# Patient Record
Sex: Female | Born: 1995 | Race: White | Hispanic: No | Marital: Single | Smoking: Former smoker
Health system: Southern US, Community
[De-identification: ages and names within clinical notes are randomized; demographics above are authoritative.]

## PROBLEM LIST (undated history)

## (undated) DIAGNOSIS — F419 Anxiety disorder, unspecified: Secondary | ICD-10-CM

---

## 2017-08-11 ENCOUNTER — Encounter (HOSPITAL_COMMUNITY): Payer: Self-pay

## 2017-08-11 DIAGNOSIS — O208 Other hemorrhage in early pregnancy: Secondary | ICD-10-CM | POA: Insufficient documentation

## 2017-08-11 DIAGNOSIS — Z3A01 Less than 8 weeks gestation of pregnancy: Secondary | ICD-10-CM | POA: Insufficient documentation

## 2017-08-11 DIAGNOSIS — Z3491 Encounter for supervision of normal pregnancy, unspecified, first trimester: Secondary | ICD-10-CM | POA: Insufficient documentation

## 2017-08-11 DIAGNOSIS — R102 Pelvic and perineal pain: Secondary | ICD-10-CM | POA: Insufficient documentation

## 2017-08-11 LAB — I-STAT BETA HCG BLOOD, ED (MC, WL, AP ONLY): HCG, QUANTITATIVE: 18.2 m[IU]/mL — AB (ref <5)

## 2017-08-11 NOTE — ED Triage Notes (Signed)
Pt states she bleed through pad in less than 30 minutes with sore breast; pt states she took pregnancy test today and it showed positive; pt states bleeding has lightened; Pt state she was due for cycle on the 1st. Pt states slight abdominal cramping at [facility]2/10-Monique,RN

## 2017-08-12 ENCOUNTER — Emergency Department (HOSPITAL_COMMUNITY)
Admission: EM | Admit: 2017-08-12 | Discharge: 2017-08-12 | Disposition: A | Discharge status: Home / Self Care | Payer: Self-pay | Attending: Emergency Medicine | Admitting: Emergency Medicine

## 2017-08-12 ENCOUNTER — Emergency Department (HOSPITAL_COMMUNITY): Payer: Self-pay

## 2017-08-12 DIAGNOSIS — O3680X0 Pregnancy with inconclusive fetal viability, not applicable or unspecified: Secondary | ICD-10-CM

## 2017-08-12 LAB — BASIC METABOLIC PANEL
ANION GAP: 9 (ref 5–15)
BUN: 12 mg/dL (ref 6–20)
CALCIUM: 9.2 mg/dL (ref 8.9–10.3)
CO2: 23 mmol/L (ref 22–32)
Chloride: 104 mmol/L (ref 101–111)
Creatinine, Ser: 0.52 mg/dL (ref 0.44–1.00)
GLUCOSE: 91 mg/dL (ref 65–99)
POTASSIUM: 3.5 mmol/L (ref 3.5–5.1)
Sodium: 136 mmol/L (ref 135–145)

## 2017-08-12 LAB — CBC
HEMATOCRIT: 37.1 % (ref 36.0–46.0)
Hemoglobin: 12.3 g/dL (ref 12.0–15.0)
MCH: 30.1 pg (ref 26.0–34.0)
MCHC: 33.2 g/dL (ref 30.0–36.0)
MCV: 90.7 fL (ref 78.0–100.0)
PLATELETS: 239 10*3/uL (ref 150–400)
RBC: 4.09 MIL/uL (ref 3.87–5.11)
RDW: 13.4 % (ref 11.5–15.5)
WBC: 7.2 10*3/uL (ref 4.0–10.5)

## 2017-08-12 LAB — TYPE AND SCREEN
ABO/RH(D): O POS
ANTIBODY SCREEN: NEGATIVE

## 2017-08-12 LAB — HCG, QUANTITATIVE, PREGNANCY: HCG, BETA CHAIN, QUANT, S: 20 m[IU]/mL — AB (ref <5)

## 2017-08-12 NOTE — Discharge Instructions (Signed)
Please report [facility] the [facility], the Maternal Admissions Unit, in 2 days for a repeat lab test to check your beta hCG level.  It is important to trend this lab value to monitor your pregnancy. It is also recommended that you have a repeat ultrasound in 2 weeks. Return to the ER if you begin having heavy vaginal bleeding, severe abdominal pain, or new or concerning symptoms.

## 2017-08-12 NOTE — ED Provider Notes (Signed)
[facility] Provider Note   CSN: [id number] Arrival date & time: 08/11/17  2122     History   Chief Complaint Chief Complaint  Patient presents with  . Vaginal Bleeding  . Possible Pregnancy    HPI Rozell SearingMarlyna Anna is a 21 y.o. female, G1P1, presenting to the ED with vaginal bleeding that began last night and stopped in the morning. She states the bleeding was heavy with associated very minor pelvic cramping.  Patient states she is 4 months postpartum, with term cesarean delivery April 15, 2017.  She states her LMP was September 1 and lasted 2 weeks, as periods are still normalizing.  States she is not breast-feeding.  Ports associated symptoms are breast tenderness.  She states she took a home pregnancy test which was positive, and presents today for concern of ectopic pregnancy due to bleeding.  Denies nausea or vomiting, current vaginal bleeding, urinary symptoms, or any other complaints. [facility].  The history is provided by the patient.    History reviewed. No pertinent past medical history.  There are no active problems to display for this patient.   Past Surgical History:  Procedure Laterality Date  . CESAREAN SECTION      OB History    No data available       Home Medications    Prior to Admission medications   Not on File    Family History History reviewed. No pertinent family history.  Social History Social History  Substance Use Topics  . Smoking status: Not on file  . Smokeless tobacco: Not on file  . Alcohol use No     Allergies   Penicillins   Review of Systems Review of Systems  Constitutional: Negative for fever.  Gastrointestinal: Negative for nausea and vomiting.  Genitourinary: Positive for pelvic pain and vaginal bleeding. Negative for dysuria and frequency.  All other systems reviewed and are negative.    Physical Exam Updated Vital Signs BP 100/65 (BP Location:  Right Arm)   Pulse 95   Temp 98.3 F (36.8 C) (Oral)   Resp 18   LMP 06/16/2017 (Approximate)   SpO2 100%   Physical Exam  Constitutional: She appears well-developed and well-nourished. No distress.  HENT:  Head: Normocephalic and atraumatic.  Eyes: Conjunctivae are normal.  Cardiovascular: Normal rate, regular rhythm, normal heart sounds and intact distal pulses.   Pulmonary/Chest: Effort normal and breath sounds normal.  Abdominal: Soft. Bowel sounds are normal. She exhibits no distension. There is no tenderness. There is no rebound and no guarding.  Genitourinary: Vagina normal. There is no rash or tenderness on the right labia. There is no rash or tenderness on the left labia. Uterus is not tender. Cervix exhibits no motion tenderness and no discharge. Right adnexum displays no mass and no tenderness. Left adnexum displays no mass and no tenderness. No tenderness or bleeding in the vagina. No vaginal discharge found.  Genitourinary Comments: Exam performed with chaperone present. Os is not open  Neurological: She is alert.  Skin: Skin is warm.  Psychiatric: She has a normal mood and affect. Her behavior is normal.  Nursing note and vitals reviewed.    ED Treatments / Results  Labs (all labs ordered are listed, but only abnormal results are displayed) Labs Reviewed  HCG, QUANTITATIVE, PREGNANCY - Abnormal; Notable for the following:       Result Value   hCG, Beta Chain, Quant, S 20 (*)    All other  components within normal limits  I-STAT BETA HCG BLOOD, ED ([facility], [facility], [facility] ONLY) - Abnormal; Notable for the following:    I-stat hCG, quantitative 18.2 (*)    All other components within normal limits  CBC  BASIC METABOLIC PANEL  TYPE AND SCREEN  ABO/RH    EKG  EKG Interpretation None       Radiology US Ob Comp < 14 Wks  Result Date: 08/12/2017 CLINICAL DATA:  Vaginal bleeding. Positive pregnancy test. Beta HCG 20. Gestational age by last menstrual period 8 weeks and 1  day. EXAM: OBSTETRIC <14 WK [place] AND TRANSVAGINAL OB US TECHNIQUE: Both transabdominal and transvaginal ultrasound examinations were performed for complete evaluation of the gestation as well as the maternal uterus, adnexal regions, and pelvic cul-de-sac. Transvaginal technique was performed to assess early pregnancy. COMPARISON:  None. FINDINGS: Intrauterine gestational sac: Present Yolk sac:  Cyst not present Embryo:  Not present Cardiac Activity: Not present MSD: 2  mm   4 w   6  d Subchorionic hemorrhage:  None visualized. Maternal uterus/adnexae: Small amount of free fluid in the pelvis. 1.3 cm RIGHT intra ovarian corpus luteal cyst. IMPRESSION: Probable early intrauterine gestational sac, but no yolk sac, fetal pole, or cardiac activity yet visualized. Recommend follow-up quantitative B-HCG levels and follow-up US in 14 days to assess viability. This recommendation follows SRU consensus guidelines: Diagnostic Criteria for Nonviable Pregnancy Early in the First Trimester. Malva Limes Med 2013; 696:2952-84. Electronically Signed   By: Awilda Metro M.D.   On: 08/12/2017 05:54   US Ob Transvaginal  Result Date: 08/12/2017 CLINICAL DATA:  Vaginal bleeding. Positive pregnancy test. Beta HCG 20. Gestational age by last menstrual period 8 weeks and 1 day. EXAM: OBSTETRIC <14 WK [place] AND TRANSVAGINAL OB US TECHNIQUE: Both transabdominal and transvaginal ultrasound examinations were performed for complete evaluation of the gestation as well as the maternal uterus, adnexal regions, and pelvic cul-de-sac. Transvaginal technique was performed to assess early pregnancy. COMPARISON:  None. FINDINGS: Intrauterine gestational sac: Present Yolk sac:  Cyst not present Embryo:  Not present Cardiac Activity: Not present MSD: 2  mm   4 w   6  d Subchorionic hemorrhage:  None visualized. Maternal uterus/adnexae: Small amount of free fluid in the pelvis. 1.3 cm RIGHT intra ovarian corpus luteal cyst. IMPRESSION: Probable early  intrauterine gestational sac, but no yolk sac, fetal pole, or cardiac activity yet visualized. Recommend follow-up quantitative B-HCG levels and follow-up US in 14 days to assess viability. This recommendation follows SRU consensus guidelines: Diagnostic Criteria for Nonviable Pregnancy Early in the First Trimester. Malva Limes Med 2013; 132:4401-02. Electronically Signed   By: Awilda Metro M.D.   On: 08/12/2017 05:54    Procedures Procedures (including critical care time)  Medications Ordered in ED Medications - No data to display   Initial Impression / Assessment and Plan / ED Course  I have reviewed the triage vital signs and the nursing notes.  Pertinent labs & imaging results that were available during my care of the patient were reviewed by me and considered in my medical decision making (see chart for details).     Pt presenting with 1 day of vaginal bleeding and positive pregnancy test.  Quantitative hCG is 20.  Hemoglobin is stable, patient is not actively bleeding.  Cervical os appears closed.  Abdomen is nontender.  Pt is nontoxic and not in distress.  Ultrasound done, showing intrauterine gestational sac without visible yolk sac, fetal pole or cardiac  activity.  Discussed these results with patient that this could either be early pregnancy versus threatened miscarriage.  Pt verbalized understanding. [facility] referral given and patient recommended to follow-up in 2 days for repeat hCG and trending.  Also discussed recommendation for patient to receive ultrasound in 2 weeks for follow-up. Pt is well-appearing and safe for discharge.  Patient discussed with Dr. Nicanor Alcon.  Discussed results, findings, treatment and follow up. Patient advised of return precautions. Patient verbalized understanding and agreed with plan.   Final Clinical Impressions(s) / ED Diagnoses   Final diagnoses:  Pregnancy of unknown anatomic location    New Prescriptions There are no discharge  medications for this patient.    Robinson, [place] N, PA-C 08/12/17 Macky Lower, April, MD 08/12/17 [phone]

## 2017-08-13 LAB — ABO/RH: ABO/RH(D): O POS

## 2018-09-26 IMAGING — US US OB TRANSVAGINAL
1 series · 14 of 28 positions shown · non-contrast
Comparison: None.

CLINICAL DATA: Vaginal bleeding. Positive pregnancy test. Beta HCG
20. Gestational age by last menstrual period 8 weeks and 1 day.

EXAM:
OBSTETRIC <14 WK US AND TRANSVAGINAL OB US
TECHNIQUE: Both transabdominal and transvaginal ultrasound examinations were
performed for complete evaluation of the gestation as well as the
maternal uterus, adnexal regions, and pelvic cul-de-sac.
Transvaginal technique was performed to assess early pregnancy.

[Series 1: us ob transvaginal · 0.23mm/px · 67 acquisitions, 14 frames shown]
[im 3/67]
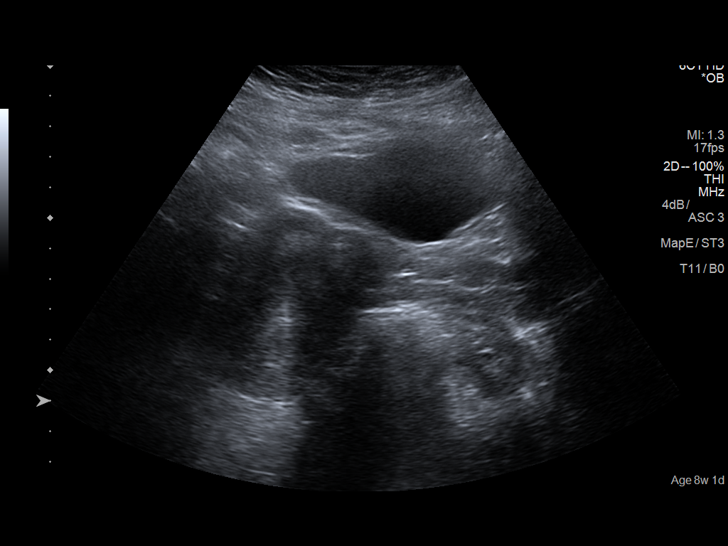
[im 8/67]
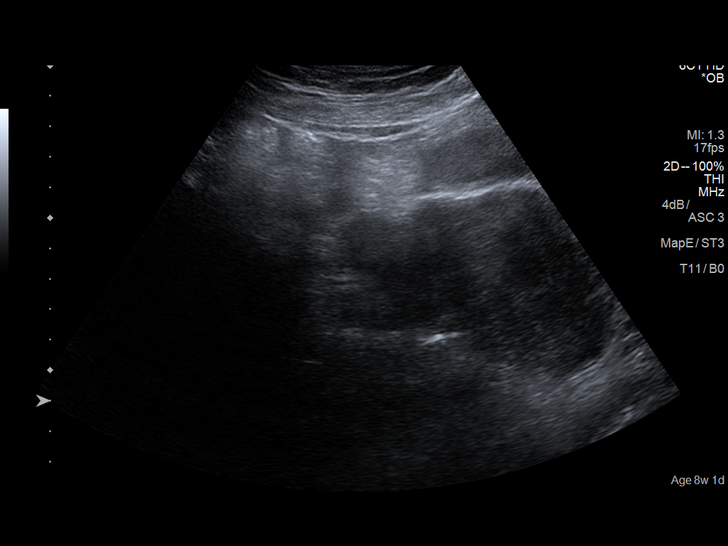
[im 13/67]
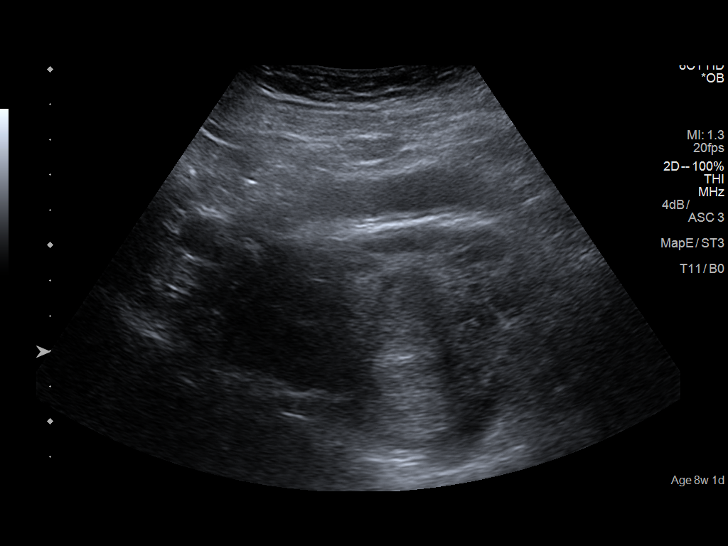
[im 18/67]
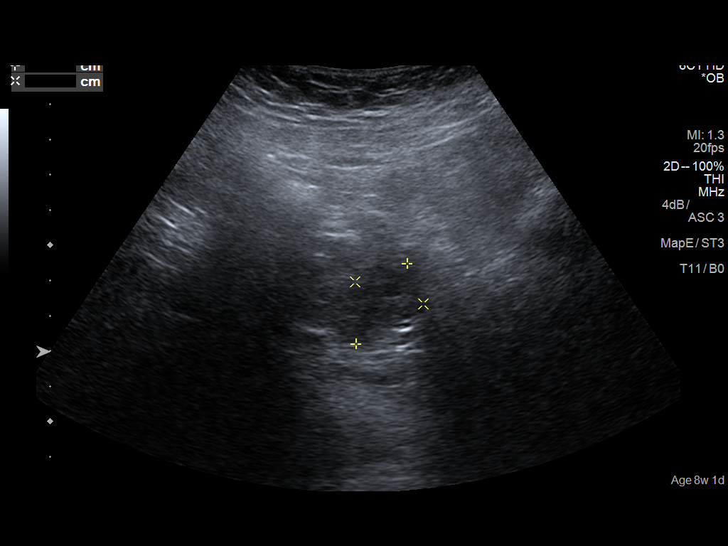
[im 23/67]
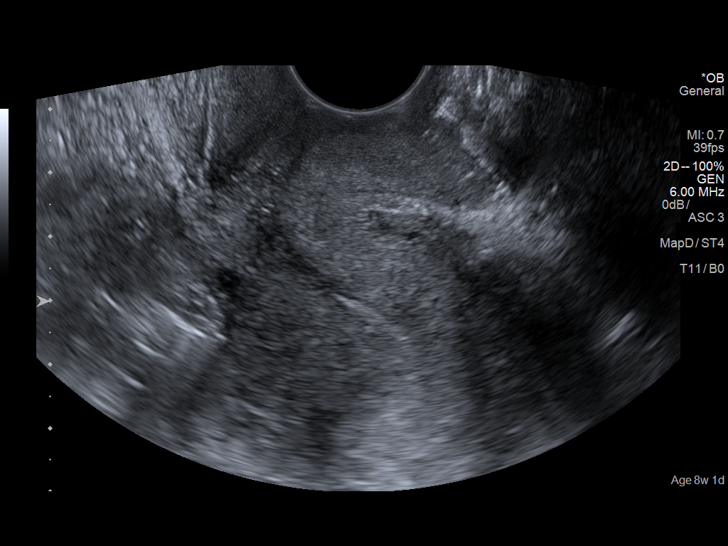
[im 27/67]
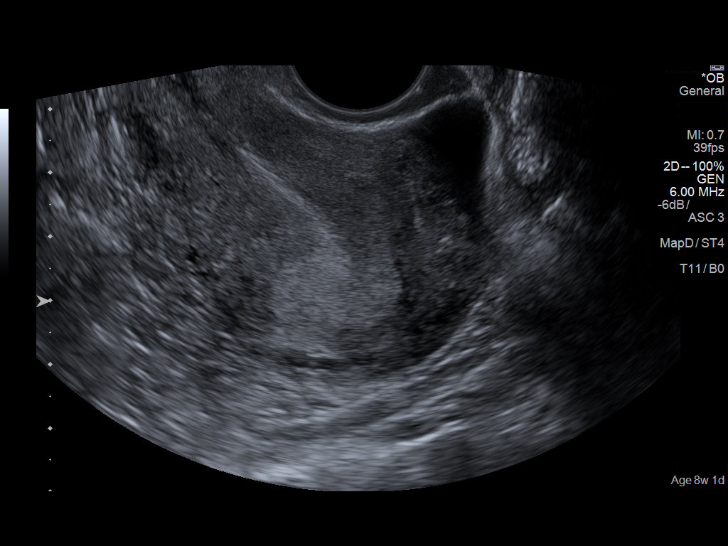
[im 32/67]
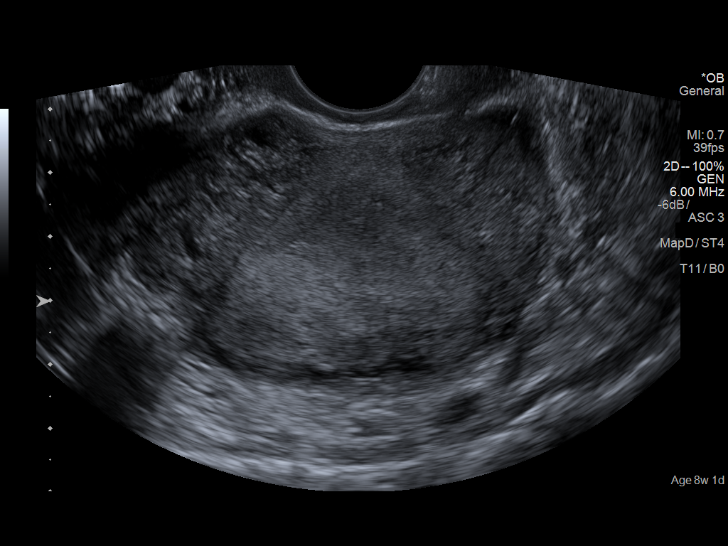
[im 37/67]
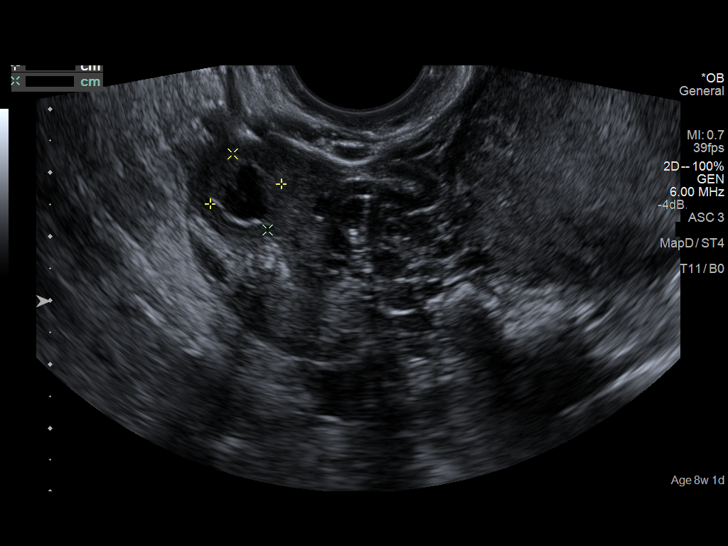
[im 42/67]
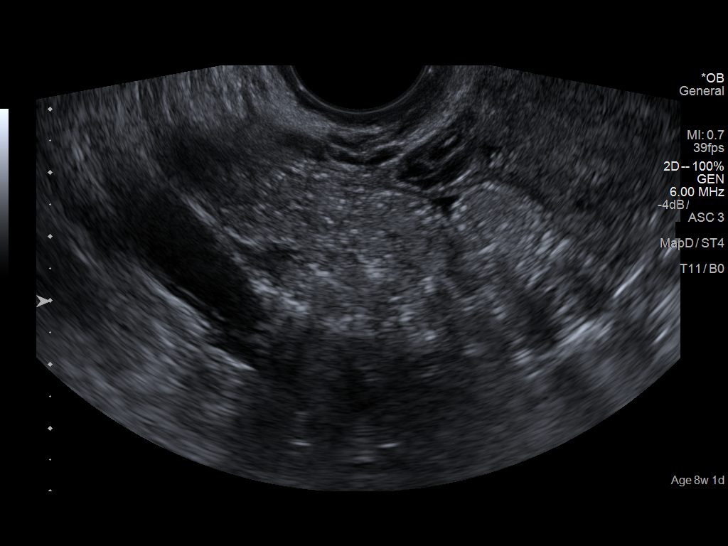
[im 47/67]
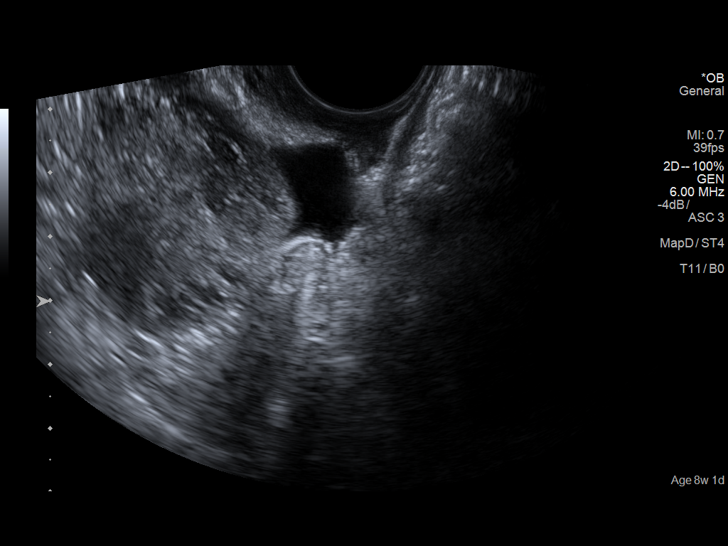
[im 52/67]
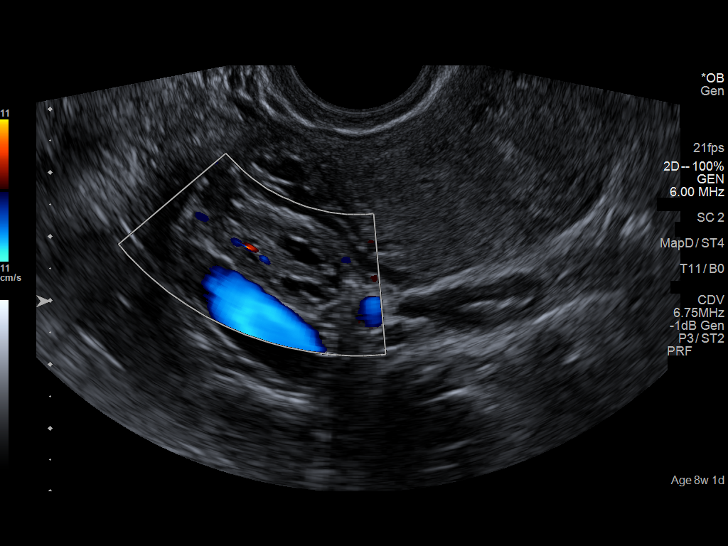
[im 57/67]
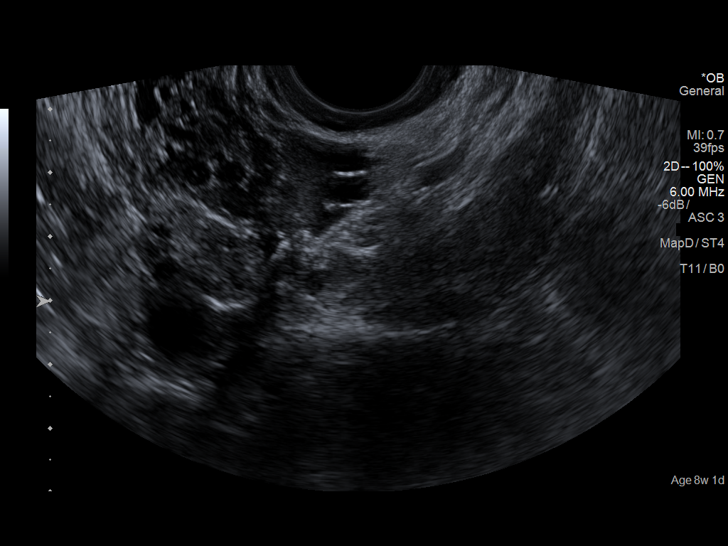
[im 62/67]
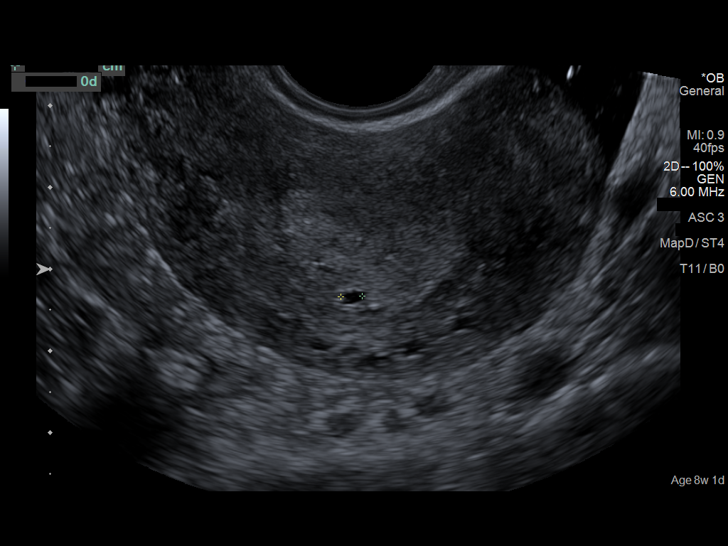
[im 67/67]
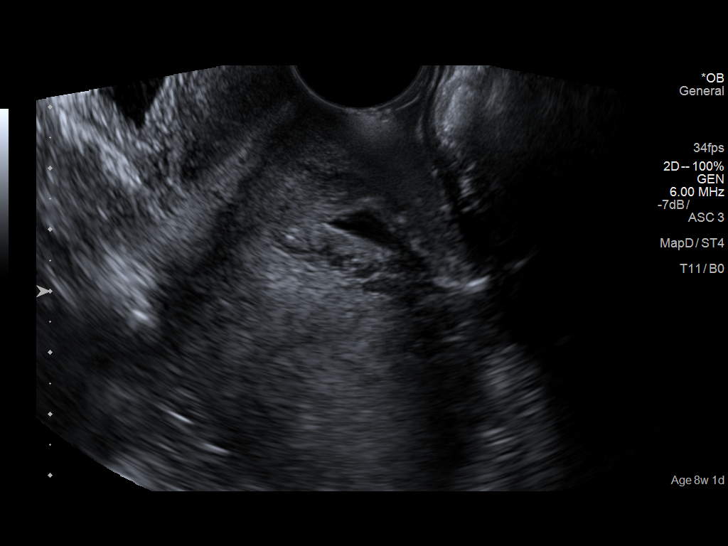

[14 of 28 positions shown; findings below may reference images not displayed]

FINDINGS: Intrauterine gestational sac: Present

Yolk sac:  Cyst not present

Embryo:  Not present

Cardiac Activity: Not present

MSD: 2  mm   4 w   6  d

Subchorionic hemorrhage:  None visualized.

Maternal uterus/adnexae: Small amount of free fluid in the pelvis.
1.3 cm RIGHT intra ovarian corpus luteal cyst.
IMPRESSION: Probable early intrauterine gestational sac, but no yolk sac, fetal
pole, or cardiac activity yet visualized. Recommend follow-up
quantitative B-HCG levels and follow-up US in 14 days to assess
viability. This recommendation follows SRU consensus guidelines:
Diagnostic Criteria for Nonviable Pregnancy Early in the First
Trimester. N Engl J Med 1521; [DATE].

## 2021-01-29 ENCOUNTER — Encounter (HOSPITAL_COMMUNITY): Payer: Self-pay

## 2021-01-29 ENCOUNTER — Other Ambulatory Visit: Payer: Self-pay

## 2021-01-29 ENCOUNTER — Inpatient Hospital Stay (HOSPITAL_COMMUNITY)
Admission: AD | Admit: 2021-01-29 | Discharge: 2021-01-30 | Disposition: A | Discharge status: Home / Self Care | Payer: BC Managed Care – PPO | Attending: Obstetrics & Gynecology | Admitting: Obstetrics & Gynecology

## 2021-01-29 ENCOUNTER — Inpatient Hospital Stay (HOSPITAL_COMMUNITY): Payer: BC Managed Care – PPO

## 2021-01-29 DIAGNOSIS — O4690 Antepartum hemorrhage, unspecified, unspecified trimester: Secondary | ICD-10-CM

## 2021-01-29 DIAGNOSIS — O26891 Other specified pregnancy related conditions, first trimester: Secondary | ICD-10-CM | POA: Diagnosis not present

## 2021-01-29 DIAGNOSIS — O26899 Other specified pregnancy related conditions, unspecified trimester: Secondary | ICD-10-CM

## 2021-01-29 DIAGNOSIS — Z3A13 13 weeks gestation of pregnancy: Secondary | ICD-10-CM | POA: Insufficient documentation

## 2021-01-29 DIAGNOSIS — R109 Unspecified abdominal pain: Secondary | ICD-10-CM | POA: Insufficient documentation

## 2021-01-29 DIAGNOSIS — Z87891 Personal history of nicotine dependence: Secondary | ICD-10-CM | POA: Insufficient documentation

## 2021-01-29 DIAGNOSIS — O209 Hemorrhage in early pregnancy, unspecified: Secondary | ICD-10-CM | POA: Diagnosis present

## 2021-01-29 DIAGNOSIS — O3680X0 Pregnancy with inconclusive fetal viability, not applicable or unspecified: Secondary | ICD-10-CM

## 2021-01-29 HISTORY — DX: Anxiety disorder, unspecified: F41.9

## 2021-01-29 LAB — CBC WITH DIFFERENTIAL/PLATELET
Abs Immature Granulocytes: 0.03 10*3/uL (ref 0.00–0.07)
Basophils Absolute: 0 10*3/uL (ref 0.0–0.1)
Basophils Relative: 0 %
Eosinophils Absolute: 0.1 10*3/uL (ref 0.0–0.5)
Eosinophils Relative: 1 %
HCT: 37.3 % (ref 36.0–46.0)
Hemoglobin: 12.6 g/dL (ref 12.0–15.0)
Immature Granulocytes: 0 %
Lymphocytes Relative: 31 %
Lymphs Abs: 2.6 10*3/uL (ref 0.7–4.0)
MCH: 31.8 pg (ref 26.0–34.0)
MCHC: 33.8 g/dL (ref 30.0–36.0)
MCV: 94.2 fL (ref 80.0–100.0)
Monocytes Absolute: 0.6 10*3/uL (ref 0.1–1.0)
Monocytes Relative: 7 %
Neutro Abs: 5.2 10*3/uL (ref 1.7–7.7)
Neutrophils Relative %: 61 %
Platelets: 279 10*3/uL (ref 150–400)
RBC: 3.96 MIL/uL (ref 3.87–5.11)
RDW: 12.5 % (ref 11.5–15.5)
WBC: 8.6 10*3/uL (ref 4.0–10.5)
nRBC: 0 % (ref 0.0–0.2)

## 2021-01-29 LAB — URINALYSIS, ROUTINE W REFLEX MICROSCOPIC
Bacteria, UA: NONE SEEN
Bilirubin Urine: NEGATIVE
Glucose, UA: NEGATIVE mg/dL
Hgb urine dipstick: NEGATIVE
Ketones, ur: NEGATIVE mg/dL
Nitrite: NEGATIVE
Protein, ur: NEGATIVE mg/dL
Specific Gravity, Urine: 1.011 (ref 1.005–1.030)
pH: 6 (ref 5.0–8.0)

## 2021-01-29 LAB — COMPREHENSIVE METABOLIC PANEL
ALT: 13 U/L (ref 0–44)
AST: 18 U/L (ref 15–41)
Albumin: 4 g/dL (ref 3.5–5.0)
Alkaline Phosphatase: 63 U/L (ref 38–126)
Anion gap: 6 (ref 5–15)
BUN: 8 mg/dL (ref 6–20)
CO2: 25 mmol/L (ref 22–32)
Calcium: 9.4 mg/dL (ref 8.9–10.3)
Chloride: 102 mmol/L (ref 98–111)
Creatinine, Ser: 0.54 mg/dL (ref 0.44–1.00)
GFR, Estimated: >60 mL/min (ref >60)
Glucose, Bld: 94 mg/dL (ref 70–99)
Potassium: 3.5 mmol/L (ref 3.5–5.1)
Sodium: 133 mmol/L — ABNORMAL LOW (ref 135–145)
Total Bilirubin: 1 mg/dL (ref 0.3–1.2)
Total Protein: 7.2 g/dL (ref 6.5–8.1)

## 2021-01-29 LAB — POC URINE PREG, ED: Preg Test, Ur: POSITIVE — AB

## 2021-01-29 NOTE — MAU Note (Signed)
Patient reports lower bilateral abdominal pain since 01/25/2021 and has gotten worse today, the pain radiates to her back and she has a shooting pain above her navel. Reports some vaginal spotting this morning.  Last intercourse: weeks Pain score: 6/10 abdominal and back cramps. 7/10 Shooting pain  LMP: End on January is unsure due to irregular periods  Today's Vitals   01/29/21 1950 01/29/21 2000 01/29/21 2125 01/29/21 2127  BP: 111/74   106/64  Pulse: 100   93  Resp: 16   16  Temp: 98.8 F (37.1 C)   98.4 F (36.9 C)  TempSrc: Oral   Oral  SpO2: 100%   100%  Weight:  63.5 kg  66.1 kg  Height:  5\' 2"  (1.575 m)    PainSc:  6  6     Body mass index is 26.67 kg/m.

## 2021-01-29 NOTE — ED Triage Notes (Signed)
Emergency Medicine Provider Triage Evaluation Note  Amber Guerrero , a 25 y.o. female  was evaluated in triage.  Pt complains of abdominal cramping, recent positive pregnancy test at home.  Maybe some spotting just morning of discharge.  Review of Systems  Positive: Abdominal cramping Negative: Persistent vaginal bleeding  Physical Exam  There were no vitals taken for this visit. Gen:   Awake, no distress   HEENT:  Atraumatic  Resp:  Normal effort  Cardiac:  Normal rate  Abd:   Nondistended, nontender  MSK:   Moves extremities without difficulty  Neuro:  Speech clear   Medical Decision Making  Medically screening exam initiated at 7:50 PM.  Appropriate orders placed.  Rozell Searing was informed that the remainder of the evaluation will be completed by another provider, this initial triage assessment does not replace that evaluation, and the importance of remaining in the ED until their evaluation is complete.  Clinical Impression  7:51 PM Will obtain POC urine pregnancy test for potential transfer to MAU. 8:32 PM POC preg test positive. I have called over to MAU and they accept the patient in transfer.   Dietrich Pates, PA-C 01/29/21 2033

## 2021-01-29 NOTE — MAU Provider Note (Signed)
History     CSN: [id number]  Arrival date and time: 01/29/21 1913   Event Date/Time   First Provider Initiated Contact with Patient 01/29/21 2154      Chief Complaint  Patient presents with  . Abdominal Pain  . Back Pain   HPI   [name] is a 25 y.o. female G3P2002 @ [redacted]w[redacted]d by uncertain LMP here in MAU with complaints of abdominal pain and lower back pain. She presented to [facility] ED and was transferred here for further evaluation.  The pain is worse In her lower back and near her umbilicus. The pain started on 4/12 and she feels the pain is getting worse. The pain is worse when she is up moving around or doing any activity with her other two children. Her other 2 kids are 78 y/o and 2 y/o.  She saw a few blood streaks this morning when she wiped, none since. Undesired pregnancy, has plans to terminate the pregnancy.   OB History    Gravida  3   Para  2   Term  2   Preterm      AB      Living  2     SAB      IAB      Ectopic      Multiple      Live Births  2           Past Medical History:  Diagnosis Date  . Anxiety     Past Surgical History:  Procedure Laterality Date  . CESAREAN SECTION      History reviewed. No pertinent family history.  Social History   Tobacco Use  . Smoking status: Former Games developer  . Smokeless tobacco: Never Used  Vaping Use  . Vaping Use: Former  Substance Use Topics  . Alcohol use: No  . Drug use: No    Allergies:  Allergies  Allergen Reactions  . Penicillins Hives    Medications Prior to Admission  Medication Sig Dispense Refill Last Dose  . escitalopram (LEXAPRO) 10 MG tablet Take 10 mg by mouth daily.      Results for orders placed or performed during the hospital encounter of 01/29/21 (from the past 48 hour(s))  Urinalysis, Routine w reflex microscopic     Status: Abnormal   Collection Time: 01/29/21  8:05 PM  Result Value Ref Range   Color, Urine YELLOW YELLOW   APPearance CLEAR CLEAR   Specific  Gravity, Urine 1.011 1.005 - 1.030   pH 6.0 5.0 - 8.0   Glucose, UA NEGATIVE NEGATIVE mg/dL   Hgb urine dipstick NEGATIVE NEGATIVE   Bilirubin Urine NEGATIVE NEGATIVE   Ketones, ur NEGATIVE NEGATIVE mg/dL   Protein, ur NEGATIVE NEGATIVE mg/dL   Nitrite NEGATIVE NEGATIVE   Leukocytes,Ua TRACE (A) NEGATIVE   RBC / HPF 0-5 0 - 5 RBC/hpf   WBC, UA 0-5 0 - 5 WBC/hpf   Bacteria, UA NONE SEEN NONE SEEN   Squamous Epithelial / LPF 0-5 0 - 5    Comment: Performed at [facility], [address], [place], [place] [address]  POC urine preg, ED     Status: Abnormal   Collection Time: 01/29/21  8:17 PM  Result Value Ref Range   Preg Test, Ur POSITIVE (A) NEGATIVE    Comment:        THE SENSITIVITY OF THIS METHODOLOGY IS >24 mIU/mL   CBC with Differential/Platelet     Status: None   Collection  Time: 01/29/21 10:16 PM  Result Value Ref Range   WBC 8.6 4.0 - 10.5 K/uL   RBC 3.96 3.87 - 5.11 MIL/uL   Hemoglobin 12.6 12.0 - 15.0 g/dL   HCT 89.3 81.0 - 17.5 %   MCV 94.2 80.0 - 100.0 fL   MCH 31.8 26.0 - 34.0 pg   MCHC 33.8 30.0 - 36.0 g/dL   RDW 10.2 58.5 - 27.7 %   Platelets 279 150 - 400 K/uL   nRBC 0.0 0.0 - 0.2 %   Neutrophils Relative % 61 %   Neutro Abs 5.2 1.7 - 7.7 K/uL   Lymphocytes Relative 31 %   Lymphs Abs 2.6 0.7 - 4.0 K/uL   Monocytes Relative 7 %   Monocytes Absolute 0.6 0.1 - 1.0 K/uL   Eosinophils Relative 1 %   Eosinophils Absolute 0.1 0.0 - 0.5 K/uL   Basophils Relative 0 %   Basophils Absolute 0.0 0.0 - 0.1 K/uL   Immature Granulocytes 0 %   Abs Immature Granulocytes 0.03 0.00 - 0.07 K/uL    Comment: Performed at [facility], [address] [place], [place] [address]  Comprehensive metabolic panel     Status: Abnormal   Collection Time: 01/29/21 10:16 PM  Result Value Ref Range   Sodium 133 (L) 135 - 145 mmol/L   Potassium 3.5 3.5 - 5.1 mmol/L   Chloride 102 98 - 111 mmol/L   CO2 25 22 - 32 mmol/L   Glucose, Bld 94 70 - 99 mg/dL    Comment:  Glucose reference range applies only to samples taken after fasting for at least 8 hours.   BUN 8 6 - 20 mg/dL   Creatinine, Ser 5.36 0.44 - 1.00 mg/dL   Calcium 9.4 8.9 - 14.4 mg/dL   Total Protein 7.2 6.5 - 8.1 g/dL   Albumin 4.0 3.5 - 5.0 g/dL   AST 18 15 - 41 U/L   ALT 13 0 - 44 U/L   Alkaline Phosphatase 63 38 - 126 U/L   Total Bilirubin 1.0 0.3 - 1.2 mg/dL   GFR, Estimated >31 >54 mL/min    Comment: (NOTE) Calculated using the CKD-EPI Creatinine Equation (2021)    Anion gap 6 5 - 15    Comment: Performed at [facility], [address], [place], [place] [address]  hCG, quantitative, pregnancy     Status: Abnormal   Collection Time: 01/29/21 10:16 PM  Result Value Ref Range   hCG, Beta Chain, Quant, S 2,586 (H) <5 mIU/mL    Comment:          GEST. AGE      CONC.  (mIU/mL)   <=1 WEEK        5 - 50     2 WEEKS       50 - 500     3 WEEKS       100 - 10,000     4 WEEKS     1,000 - 30,000     5 WEEKS     3,500 - 115,000   6-8 WEEKS     12,000 - 270,000    12 WEEKS     15,000 - 220,000        FEMALE AND NON-PREGNANT FEMALE:     LESS THAN 5 mIU/mL Performed at [facility], [address], [place], [place] [address]    US OB LESS THAN 14 WEEKS WITH [place] TRANSVAGINAL  Result Date: 01/29/2021  CLINICAL DATA:  Pain EXAM: OBSTETRIC <14 WK [place] AND TRANSVAGINAL OB US TECHNIQUE: Both transabdominal and transvaginal ultrasound examinations were performed for complete evaluation of the gestation as well as the maternal uterus, adnexal regions, and pelvic cul-de-sac. Transvaginal technique was performed to assess early pregnancy. COMPARISON:  None. FINDINGS: Intrauterine gestational sac: None Yolk sac:  Not Visualized. Embryo:  Not Visualized. Cardiac Activity: Not Visualized. Heart Rate:   bpm MSD:   mm    w     d CRL:    mm    w    d                  [place] EDC: Subchorionic hemorrhage:  None visualized. Maternal uterus/adnexae: No adnexal mass or free fluid. Fluid within the endometrial  canal. IMPRESSION: No intrauterine pregnancy visualized. Differential considerations would include early intrauterine pregnancy too early to visualize, spontaneous abortion, or occult ectopic pregnancy. Recommend close clinical followup and serial quantitative beta HCGs and ultrasounds. Electronically Signed   By: Charlett Nose M.D.   On: 01/29/2021 22:55   Review of Systems  Gastrointestinal: Positive for abdominal pain.  Genitourinary: Negative for vaginal bleeding.   Physical Exam   Blood pressure 106/64, pulse 93, temperature 98.4 F (36.9 C), temperature source Oral, resp. rate 16, height 5\' 2"  (1.575 m), weight 66.1 kg, last menstrual period 10/30/2020, SpO2 100 %.  Physical Exam Constitutional:      Appearance: She is well-developed and normal weight.  Eyes:     Pupils: Pupils are equal, round, and reactive to light.  Abdominal:     Tenderness: There is generalized abdominal tenderness. There is no guarding or rebound.     Hernia: A hernia (reducible ) is present. Hernia is present in the umbilical area.  Skin:    General: Skin is warm.  Neurological:     Mental Status: She is alert and oriented to person, place, and time.    MAU Course  Procedures  MDM  O positive blood type.  GC off urine CBC, Hcg, ABO 11/01/2020 OB transvaginal  Very undesired pregnancy, she has an appointment for TAB. Reviewed patient with Dr. [place] in detail. Quant >2500 and nothing in the uterus. Findings concerning for ectopic, however patient is stable for DC home. Will bring her back to MAU on Tuesday morning as she will likely need treatment for ectopic. She is aware of the plan and agreeable.   Assessment and Plan   A:  1. Pregnancy of unknown anatomic location   2. Abdominal cramping affecting pregnancy   3. Vaginal bleeding in pregnancy     P:  Discharge home with strict return precautions Return to MAU in 48 hours for quant and possibly MTX treatment Return to MAU sooner if symptoms  worsen Pelvic rest Ok to use tylenol as directed on the bottle.   Thursday I, NP 01/30/2021 1:06 AM

## 2021-01-29 NOTE — ED Triage Notes (Signed)
Pt reports that she has been having lower back pain, abdominal cramping, and a sharp pain around belly button. Denies nausea/vomiting. Denies urinary symptoms.

## 2021-01-29 NOTE — ED Notes (Signed)
Pt in restroom at this time.

## 2021-01-30 LAB — HIV ANTIBODY (ROUTINE TESTING W REFLEX): HIV Screen 4th Generation wRfx: NONREACTIVE

## 2021-01-30 LAB — HCG, QUANTITATIVE, PREGNANCY: hCG, Beta Chain, Quant, S: 2586 m[IU]/mL — ABNORMAL HIGH (ref <5)

## 2021-02-01 ENCOUNTER — Inpatient Hospital Stay (HOSPITAL_COMMUNITY): Payer: BC Managed Care – PPO

## 2021-02-01 ENCOUNTER — Other Ambulatory Visit: Payer: Self-pay

## 2021-02-01 ENCOUNTER — Inpatient Hospital Stay (HOSPITAL_COMMUNITY)
Admission: AD | Admit: 2021-02-01 | Discharge: 2021-02-01 | Disposition: A | Discharge status: Home / Self Care | Payer: BC Managed Care – PPO | Attending: Obstetrics and Gynecology | Admitting: Obstetrics and Gynecology

## 2021-02-01 ENCOUNTER — Encounter (HOSPITAL_COMMUNITY): Payer: Self-pay | Admitting: Obstetrics and Gynecology

## 2021-02-01 DIAGNOSIS — O26891 Other specified pregnancy related conditions, first trimester: Secondary | ICD-10-CM | POA: Insufficient documentation

## 2021-02-01 DIAGNOSIS — R109 Unspecified abdominal pain: Secondary | ICD-10-CM

## 2021-02-01 DIAGNOSIS — R1032 Left lower quadrant pain: Secondary | ICD-10-CM | POA: Diagnosis present

## 2021-02-01 DIAGNOSIS — O26899 Other specified pregnancy related conditions, unspecified trimester: Secondary | ICD-10-CM | POA: Diagnosis not present

## 2021-02-01 DIAGNOSIS — O3680X0 Pregnancy with inconclusive fetal viability, not applicable or unspecified: Secondary | ICD-10-CM

## 2021-02-01 DIAGNOSIS — Z3A13 13 weeks gestation of pregnancy: Secondary | ICD-10-CM | POA: Diagnosis not present

## 2021-02-01 LAB — HCG, QUANTITATIVE, PREGNANCY: hCG, Beta Chain, Quant, S: 5519 m[IU]/mL — ABNORMAL HIGH (ref <5)

## 2021-02-01 NOTE — Discharge Instructions (Signed)
Return to care  If you have heavier bleeding that soaks through more that 2 pads per hour for an hour or more If you bleed so much that you feel like you might pass out or you do pass out If you have significant abdominal pain that is not improved with Tylenol   

## 2021-02-01 NOTE — MAU Note (Signed)
Amber Guerrero is a 25 y.o. at [redacted]w[redacted]d here in MAU reporting: here for follow up hcg. Denies bleeding but is having some intermittent abdominal pain.  Onset of complaint: ongoing  Pain score: 6/10  Vitals:   02/01/21 0849  BP: 105/61  Pulse: 100  Resp: 16  Temp: 98.2 F (36.8 C)  SpO2: 100%     Lab orders placed from triage: hcg

## 2021-02-01 NOTE — [facility] Provider Note (Signed)
History   Chief Complaint:  Follow-up   Amber Guerrero is  25 y.o. [id number] Patient's last menstrual period was 10/30/2020 (approximate).. Patient is here for follow up of quantitative HCG and ongoing surveillance of pregnancy status. She is [redacted]w[redacted]d weeks gestation  by unsure LMP.    Since her last visit, the patient is without new complaint. The patient reports bleeding as  none now.  She continues to reports LLQ abdominal pain.  Her previous Quantitative HCG values are: 2586 on 4/16  Ultrasound on 4/16 showed no IUP or adnexal mass.   Physical Exam   Blood pressure 105/61, pulse 100, temperature 98.2 F (36.8 C), temperature source Oral, resp. rate 16, height 5\' 2"  (1.575 m), weight 65.9 kg, last menstrual period 10/30/2020, SpO2 100 %.  Physical Examination: General appearance - alert, well appearing, and in no distress Mental status - normal mood, behavior, speech, dress, motor activity, and thought processes Chest - normal respiratory effort Abdomen - soft, no tenderness  Labs: Results for orders placed or performed during the hospital encounter of 02/01/21 (from the past 24 hour(s))  hCG, quantitative, pregnancy   Collection Time: 02/01/21  8:54 AM  Result Value Ref Range   hCG, Beta Chain, Quant, S 5,519 (H) <5 mIU/mL    Ultrasound Studies:   02/03/21 OB Transvaginal  Result Date: 02/01/2021 CLINICAL DATA:  Intermittent abdominal pain. EXAM: OBSTETRIC <14 WK 02/03/2021 AND TRANSVAGINAL OB US TECHNIQUE: Both transabdominal and transvaginal ultrasound examinations were performed for complete evaluation of the gestation as well as the maternal uterus, adnexal regions, and pelvic cul-de-sac. Transvaginal technique was performed to assess early pregnancy. COMPARISON:  01/29/2021. FINDINGS: Intrauterine gestational sac: Single Yolk sac:  None visualized Embryo:  None visualized Cardiac Activity: None visualized MSD: 5.1 mm   5 w   2 d Subchorionic hemorrhage: Small subchorionic hemorrhage cannot be  excluded. Maternal uterus/adnexae: Unremarkable. IMPRESSION: 1. Probable 5.1 mm intrauterine gestational sac. No yolk sac, fetal pole, or cardiac activity yet visualized. Recommend follow-up quantitative B-HCG levels and follow-up 01/31/2021 in 14 days to assess viability. This recommendation follows SRU consensus guidelines: Diagnostic Criteria for Nonviable Pregnancy Early in the First Trimester. [place] Med 20132014. 2.  Small subchorionic hemorrhage cannot be excluded. Electronically Signed   By: ; [id number]  Register   On: 02/01/2021 11:59     Assessment:   1. Pregnancy of unknown anatomic location   2. Abdominal pain during pregnancy in first trimester   -appropriate rise in HCG. Ultrasound today now shows empty IUGS, no adnexal mass. Reviewed with Dr. 02/03/2021 who recommends f/u ultrasound in 7-10 days.     Plan: -Discharge home in stable condition -SAB vs ectopic precautions discussed -Patient advised to follow-up with ultrasound in 7-10 days -Patient may return to [facility]U as needed or if her condition were to change or worsen  9-10, NP 02/01/2021, 3:22 PM

## 2021-03-17 ENCOUNTER — Ambulatory Visit (HOSPITAL_COMMUNITY)
Admission: EM | Admit: 2021-03-17 | Discharge: 2021-03-17 | Disposition: A | Discharge status: Home / Self Care | Payer: BC Managed Care – PPO

## 2021-03-17 ENCOUNTER — Other Ambulatory Visit: Payer: Self-pay

## 2021-03-17 ENCOUNTER — Encounter (HOSPITAL_COMMUNITY): Payer: Self-pay | Admitting: Emergency Medicine

## 2021-03-17 DIAGNOSIS — J069 Acute upper respiratory infection, unspecified: Secondary | ICD-10-CM

## 2021-03-17 MED ORDER — CETIRIZINE HCL 10 MG PO TABS: 10.0000 mg | ORAL_TABLET | Freq: Every day | ORAL | 0 refills | Status: AC

## 2021-03-17 MED ORDER — FLUTICASONE PROPIONATE 50 MCG/ACT NA SUSP: 2.0000 | Freq: Every day | NASAL | 0 refills | Status: AC

## 2021-03-17 MED ORDER — PROMETHAZINE-DM 6.25-15 MG/5ML PO SYRP: 5.0000 mL | ORAL_SOLUTION | Freq: Every evening | ORAL | 0 refills | Status: AC | PRN

## 2021-03-17 MED ORDER — BENZONATATE 100 MG PO CAPS: 100.0000 mg | ORAL_CAPSULE | Freq: Three times a day (TID) | ORAL | 0 refills | Status: AC | PRN

## 2021-03-17 MED ORDER — PSEUDOEPHEDRINE HCL 60 MG PO TABS: 60.0000 mg | ORAL_TABLET | Freq: Three times a day (TID) | ORAL | 0 refills | Status: AC | PRN

## 2021-03-17 NOTE — ED Provider Notes (Signed)
[facility]   MRN: [id number] DOB: Mar 09, 1996  Subjective:   Amber Guerrero is a 25 y.o. female presenting for 1 week history of persistent sinus congestion, productive cough and chest tightness.  Patient states that she has used Mucinex, NyQuil with minimal relief.  She has had 2 sick contacts with her kids but states that they had ear infections.  Denies fever, sinus pain, ear drainage, ear pain, chest pain, shortness of breath.  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  Does not want any COVID-19 testing today.  She is not a smoker, no alcohol use.  No current facility-administered medications for this encounter.  Current Outpatient Medications:  .  escitalopram (LEXAPRO) 10 MG tablet, Take 10 mg by mouth daily., Disp: , Rfl:    Allergies  Allergen Reactions  . Penicillins Hives    Past Medical History:  Diagnosis Date  . Anxiety      Past Surgical History:  Procedure Laterality Date  . CESAREAN SECTION      No family history on file.  Social History   Tobacco Use  . Smoking status: Former Games developer  . Smokeless tobacco: Never Used  Vaping Use  . Vaping Use: Former  Substance Use Topics  . Alcohol use: No  . Drug use: No    ROS   Objective:   Vitals: BP 116/72 (BP Location: Right Arm)   Pulse (!) 117   Temp 98.6 F (37 C) (Oral)   Resp 18   LMP 10/30/2020 (Approximate)   SpO2 100%   Breastfeeding Unknown   Physical Exam Constitutional:      General: She is not in acute distress.    Appearance: Normal appearance. She is well-developed. She is not ill-appearing, toxic-appearing or diaphoretic.  HENT:     Head: Normocephalic and atraumatic.     Right Ear: Tympanic membrane, ear canal and external ear normal. No drainage or tenderness. No middle ear effusion. Tympanic membrane is not erythematous.     Left Ear: Tympanic membrane, ear canal and external ear normal. No drainage or tenderness.  No middle ear  effusion. Tympanic membrane is not erythematous.     Nose: Congestion and rhinorrhea present.     Comments: No sinus tenderness.    Mouth/Throat:     Mouth: Mucous membranes are moist. No oral lesions.     Pharynx: No pharyngeal swelling, oropharyngeal exudate, posterior oropharyngeal erythema or uvula swelling.     Tonsils: No tonsillar exudate or tonsillar abscesses.  Eyes:     General: No scleral icterus.       Right eye: No discharge.        Left eye: No discharge.     Extraocular Movements: Extraocular movements intact.     Right eye: Normal extraocular motion.     Left eye: Normal extraocular motion.     Conjunctiva/sclera: Conjunctivae normal.     Pupils: Pupils are equal, round, and reactive to light.  Cardiovascular:     Rate and Rhythm: Normal rate and regular rhythm.     Pulses: Normal pulses.     Heart sounds: Normal heart sounds. No murmur heard. No friction rub. No gallop.   Pulmonary:     Effort: Pulmonary effort is normal. No respiratory distress.     Breath sounds: Normal breath sounds. No stridor. No wheezing, rhonchi or rales.  Musculoskeletal:     Cervical back: Normal range of motion and neck supple.  Lymphadenopathy:  Cervical: No cervical adenopathy.  Skin:    General: Skin is warm and dry.     Findings: No rash.  Neurological:     General: No focal deficit present.     Mental Status: She is alert and oriented to person, place, and time.  Psychiatric:        Mood and Affect: Mood normal.        Behavior: Behavior normal.        Thought Content: Thought content normal.        Judgment: Judgment normal.     Assessment and Plan :   PDMP not reviewed this encounter.  1. Viral URI with cough     Suspect viral URI, viral syndrome.  Patient refused COVID-19 test.  Physical exam findings reassuring and vital signs stable for discharge. Advised supportive care, offered symptomatic relief.  If patient symptoms persist, recommended recheck, consider  antibiotic use as it would be appropriate given the timeline.  Counseled patient on potential for adverse effects with medications prescribed/recommended today, ER and return-to-clinic precautions discussed, patient verbalized understanding.     Wallis Bamberg, [place] 03/17/21 1907

## 2021-03-17 NOTE — ED Triage Notes (Signed)
Last Thursday started feeling badly.  Children have been sick as well.  Patient has had a deep cough, thick, dark green phlegm and chest tightness

## 2021-03-20 ENCOUNTER — Other Ambulatory Visit: Payer: Self-pay

## 2021-03-20 ENCOUNTER — Encounter (HOSPITAL_COMMUNITY): Payer: Self-pay | Admitting: Emergency Medicine

## 2021-03-20 ENCOUNTER — Ambulatory Visit (HOSPITAL_COMMUNITY)
Admission: EM | Admit: 2021-03-20 | Discharge: 2021-03-20 | Disposition: A | Discharge status: Home / Self Care | Payer: BC Managed Care – PPO | Attending: Emergency Medicine | Admitting: Emergency Medicine

## 2021-03-20 DIAGNOSIS — B9689 Other specified bacterial agents as the cause of diseases classified elsewhere: Secondary | ICD-10-CM

## 2021-03-20 DIAGNOSIS — H1031 Unspecified acute conjunctivitis, right eye: Secondary | ICD-10-CM

## 2021-03-20 DIAGNOSIS — J019 Acute sinusitis, unspecified: Secondary | ICD-10-CM

## 2021-03-20 MED ORDER — POLYMYXIN B-TRIMETHOPRIM 10000-0.1 UNIT/ML-% OP SOLN: 1.0000 [drp] | OPHTHALMIC | 0 refills | Status: AC

## 2021-03-20 MED ORDER — DOXYCYCLINE HYCLATE 100 MG PO CAPS: 100.0000 mg | ORAL_CAPSULE | Freq: Two times a day (BID) | ORAL | 0 refills | Status: AC

## 2021-03-20 NOTE — ED Triage Notes (Signed)
Pt presents for follow-up after visit on 03/17/21. States symptoms have not gotten any better with medications prescribed at last visit. States still having productive cough, headache, and low grade fevers.

## 2021-03-20 NOTE — Discharge Instructions (Signed)
Take antibiotic twice a day for 7 days   Can continue use of medications prescribed to help symptoms

## 2021-03-20 NOTE — ED Provider Notes (Signed)
[facility]    CSN: [id number] Arrival date & time: 03/20/21  1136      History   Chief Complaint Chief Complaint  Patient presents with  . Cough    Productive  . Eye Pain  . Fever    HPI Amber Guerrero is a 25 y.o. female.   Patient presents with nasal congestion, productive cough, low grade fevers, sore throat, post nasal drip, frontal headache, ear fullness, facial pain, wheezing at nighttime for 11 days. Endorses green discharge, redness and itching of right eye for 2 days. Denies abdominal pain, nausea, vomiting and diarrhea, chest pain. Seen in UC on 03/17/21, diagnosed as viral URI. Has been taking prescribed medications as ordered with no relief. Children were sick with URI, ear aches and pink eye prior to patient becoming ill.   Past Medical History:  Diagnosis Date  . Anxiety     There are no problems to display for this patient.   Past Surgical History:  Procedure Laterality Date  . CESAREAN SECTION      OB History    Gravida  3   Para  2   Term  2   Preterm      AB      Living  2     SAB      IAB      Ectopic      Multiple      Live Births  2            Home Medications    Prior to Admission medications   Medication Sig Start Date End Date Taking? Authorizing Provider  doxycycline (VIBRAMYCIN) 100 MG capsule Take 1 capsule (100 mg total) by mouth 2 (two) times daily. 03/20/21  Yes Brayley Mackowiak R, NP  trimethoprim-polymyxin b (POLYTRIM) ophthalmic solution Place 1 drop into both eyes every 4 (four) hours. 03/20/21  Yes Damarion Mendizabal R, NP  benzonatate (TESSALON) 100 MG capsule Take 1-2 capsules (100-200 mg total) by mouth 3 (three) times daily as needed. 03/17/21   Wallis Bamberg, PA-C  cetirizine (ZYRTEC ALLERGY) 10 MG tablet Take 1 tablet (10 mg total) by mouth daily. 03/17/21   Wallis Bamberg, PA-C  escitalopram (LEXAPRO) 10 MG tablet Take 10 mg by mouth daily.    [provider]  escitalopram (LEXAPRO) 20 MG tablet  Take 1 tablet by mouth daily. 02/17/21   [provider]  fluticasone (FLONASE) 50 MCG/ACT nasal spray Place 2 sprays into both nostrils daily. 03/17/21   Wallis Bamberg, PA-C  promethazine-dextromethorphan (PROMETHAZINE-DM) 6.25-15 MG/5ML syrup Take 5 mLs by mouth at bedtime as needed for cough. 03/17/21   Wallis Bamberg, PA-C  pseudoephedrine (SUDAFED) 60 MG tablet Take 1 tablet (60 mg total) by mouth every 8 (eight) hours as needed for congestion. 03/17/21   Wallis Bamberg, PA-C    Family History Family History  Problem Relation Age of Onset  . Hypertension Father   . Schizophrenia Father     Social History Social History   Tobacco Use  . Smoking status: Former Games developer  . Smokeless tobacco: Never Used  Vaping Use  . Vaping Use: Former  Substance Use Topics  . Alcohol use: No  . Drug use: No     Allergies   Penicillins   Review of Systems Review of Systems  Defer to HPI    Physical Exam Triage Vital Signs ED Triage Vitals  Enc Vitals Group     BP 03/20/21 1253 99/66     Pulse  Rate 03/20/21 1253 91     Resp 03/20/21 1253 18     Temp 03/20/21 1253 98.4 F (36.9 C)     Temp Source 03/20/21 1253 Oral     SpO2 03/20/21 1253 100 %     Weight --      Height --      Head Circumference --      Peak Flow --      Pain Score 03/20/21 1251 0     Pain Loc --      Pain Edu? --      Excl. in GC? --    No data found.  Updated Vital Signs BP 99/66 (BP Location: Right Arm)   Pulse 91   Temp 98.4 F (36.9 C) (Oral)   Resp 18   LMP 10/30/2020 (Approximate)   SpO2 100%   Visual Acuity Right Eye Distance:   Left Eye Distance:   Bilateral Distance:    Right Eye Near:   Left Eye Near:    Bilateral Near:     Physical Exam Constitutional:      Appearance: Normal appearance. She is normal weight.  HENT:     Head: Normocephalic.     Right Ear: Tympanic membrane, ear canal and external ear normal.     Left Ear: Tympanic membrane, ear canal and external ear normal.      Nose: Congestion and rhinorrhea present.     Right Sinus: Frontal sinus tenderness present.     Left Sinus: Frontal sinus tenderness present.     Mouth/Throat:     Mouth: Mucous membranes are moist.     Pharynx: Posterior oropharyngeal erythema present.  Eyes:     General: Lids are normal. Lids are everted, no foreign bodies appreciated. Vision grossly intact. Gaze aligned appropriately.     Extraocular Movements: Extraocular movements intact.     Conjunctiva/sclera:     Right eye: Right conjunctiva is not injected. Exudate and hemorrhage present. No chemosis.    Pupils: Pupils are equal, round, and reactive to light.  Cardiovascular:     Rate and Rhythm: Normal rate and regular rhythm.     Pulses: Normal pulses.     Heart sounds: Normal heart sounds.  Pulmonary:     Effort: Pulmonary effort is normal.     Breath sounds: Normal breath sounds.  Abdominal:     General: Abdomen is flat. Bowel sounds are normal.     Palpations: Abdomen is soft.  Musculoskeletal:        General: Normal range of motion.     Cervical back: Normal range of motion and neck supple.  Skin:    General: Skin is warm and dry.  Neurological:     Mental Status: She is alert and oriented to person, place, and time. Mental status is at baseline.      UC Treatments / Results  Labs (all labs ordered are listed, but only abnormal results are displayed) Labs Reviewed - No data to display  EKG   Radiology No results found.  Procedures Procedures (including critical care time)  Medications Ordered in UC Medications - No data to display  Initial Impression / Assessment and Plan / UC Course  I have reviewed the triage vital signs and the nursing notes.  Pertinent labs & imaging results that were available during my care of the patient were reviewed by me and considered in my medical decision making (see chart for details).  Acute bacterial sinusitis Acute bacterial conjunctivitis of right  eye  1.  Doxycyline 100 mg bid for 7 days 2. Polytrim 1 drop every 4 hours to affected eye 3. Continue use of otc medications for symptom management  Final Clinical Impressions(s) / UC Diagnoses   Final diagnoses:  Acute bacterial sinusitis     Discharge Instructions     Take antibiotic twice a day for 7 days   Can continue use of medications prescribed to help symptoms    ED Prescriptions    Medication Sig Dispense Auth. Provider   doxycycline (VIBRAMYCIN) 100 MG capsule Take 1 capsule (100 mg total) by mouth 2 (two) times daily. 14 capsule Brittney Caraway R, NP   trimethoprim-polymyxin b (POLYTRIM) ophthalmic solution Place 1 drop into both eyes every 4 (four) hours. 10 mL Valinda Hoar, NP     PDMP not reviewed this encounter.   Valinda Hoar, NP 03/20/21 1347

## 2021-09-12 IMAGING — US US OB < 14 WEEKS - US OB TV
1 series · 15 of 28 positions shown · non-contrast
Comparison: None.

CLINICAL DATA: Pain

EXAM:
OBSTETRIC <14 WK US AND TRANSVAGINAL OB US
TECHNIQUE: Both transabdominal and transvaginal ultrasound examinations were
performed for complete evaluation of the gestation as well as the
maternal uterus, adnexal regions, and pelvic cul-de-sac.
Transvaginal technique was performed to assess early pregnancy.

[Series 1: us ob < 14 weeks - us ob tv · 46 acquisitions, 15 frames shown]
[im 1/46]
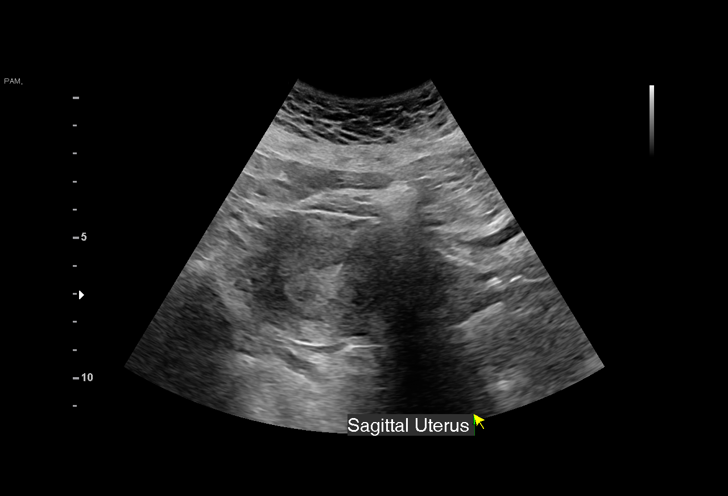
[im 4/46]
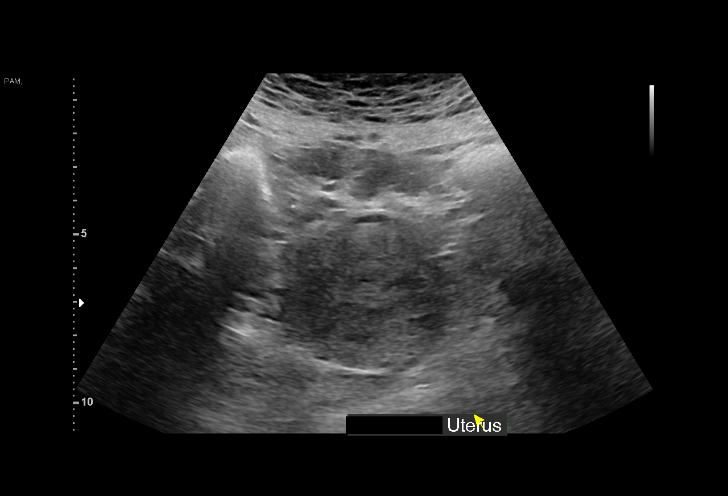
[im 7/46]
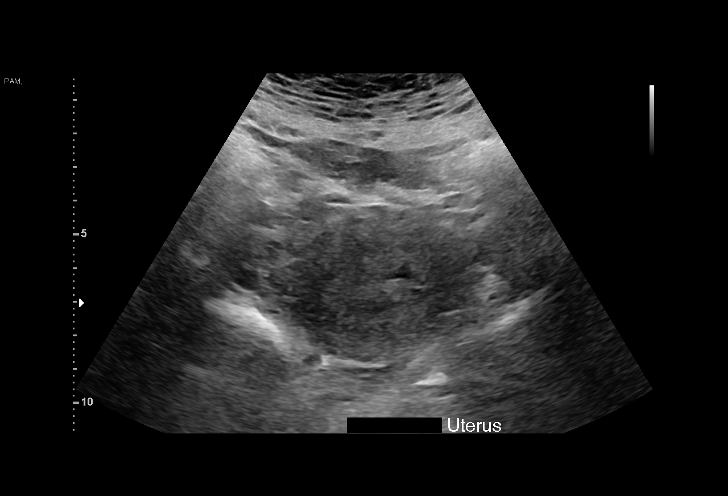
[im 11/46]
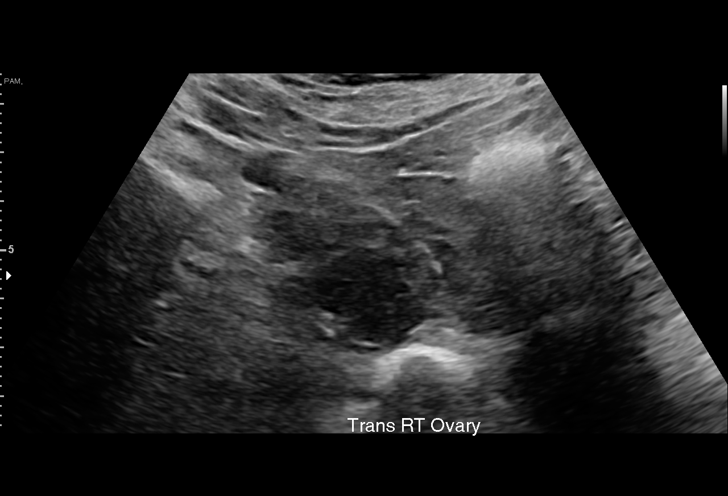
[im 14/46]
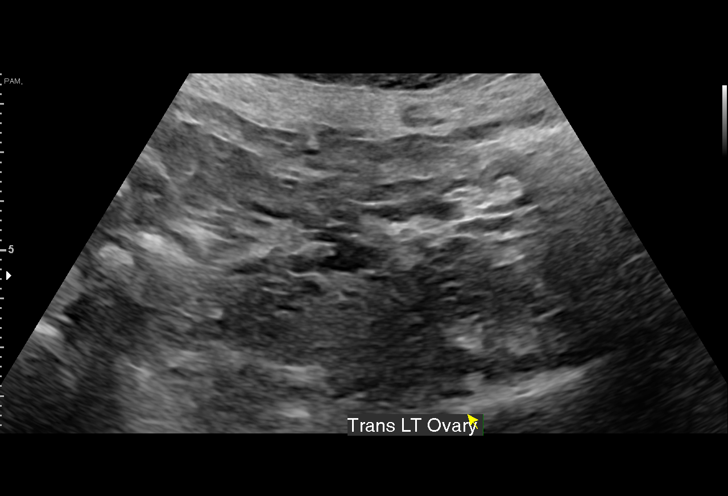
[im 17/46]
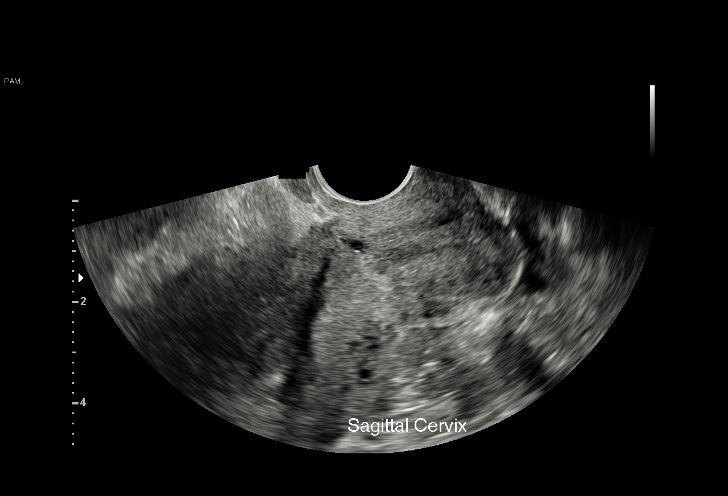
[im 21/46]
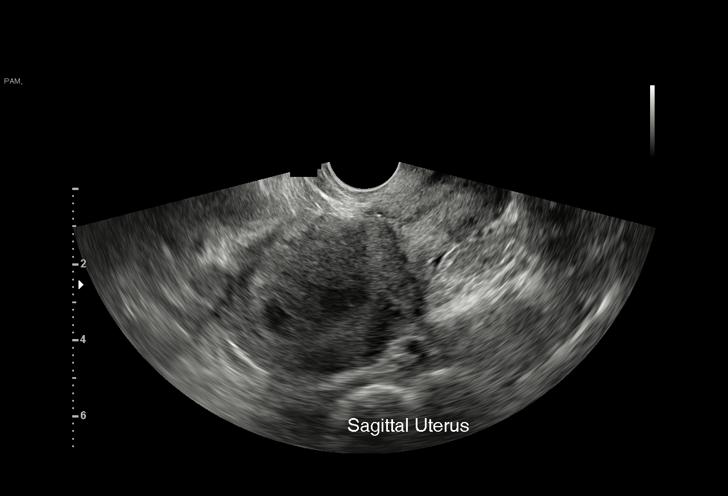
[im 24/46]
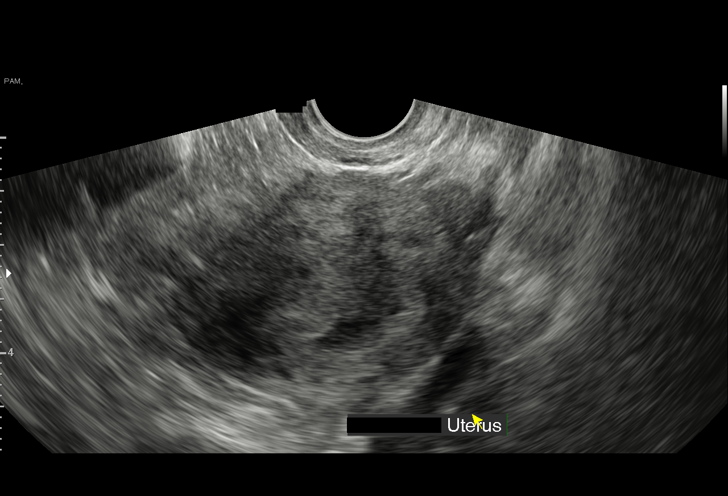
[im 26/46]
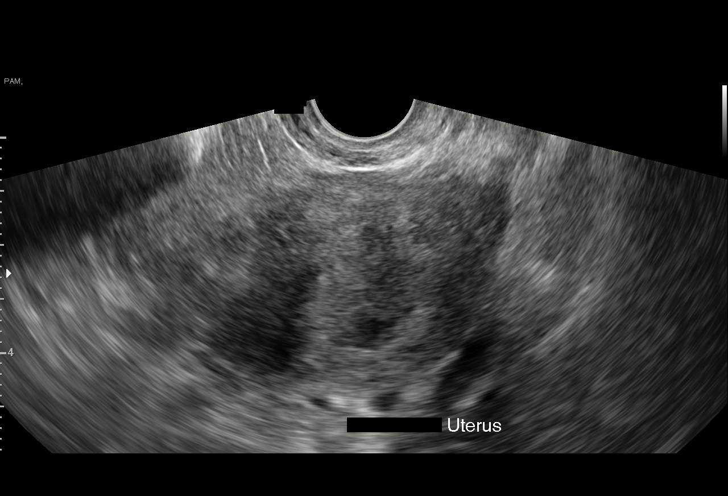
[im 29/46]
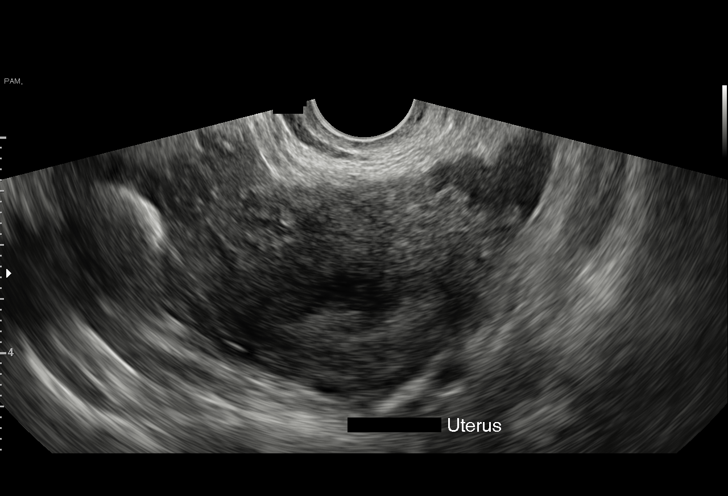
[im 32/46]
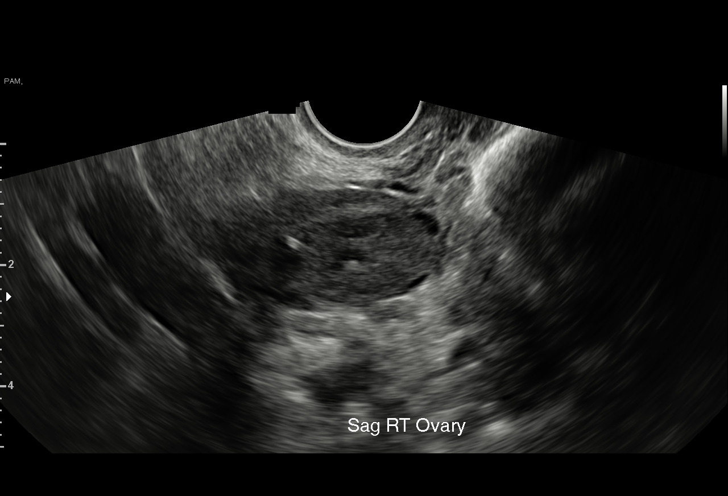
[im 36/46]
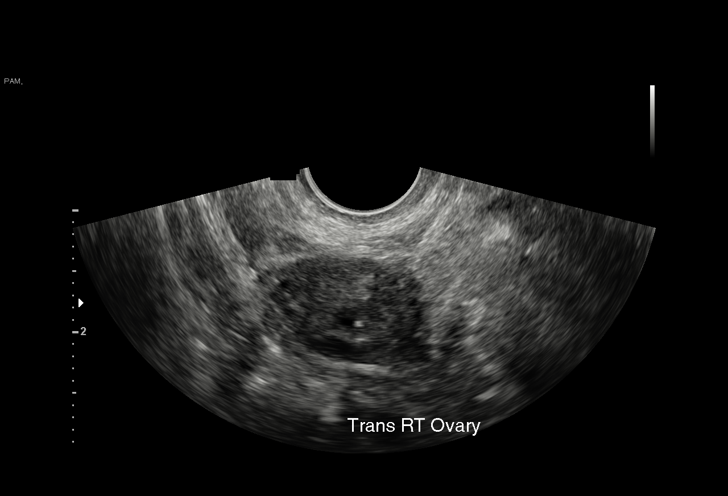
[im 39/46]
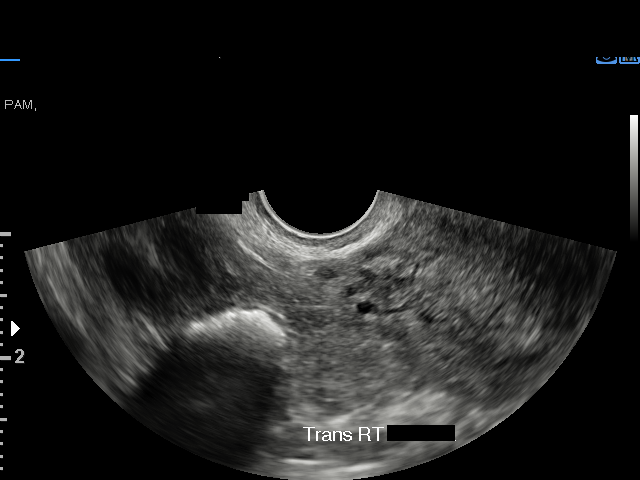
[im 42/46]
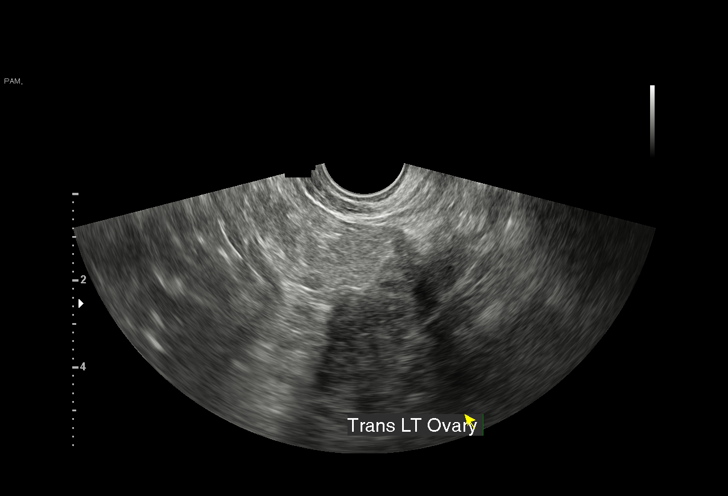
[im 46/46]
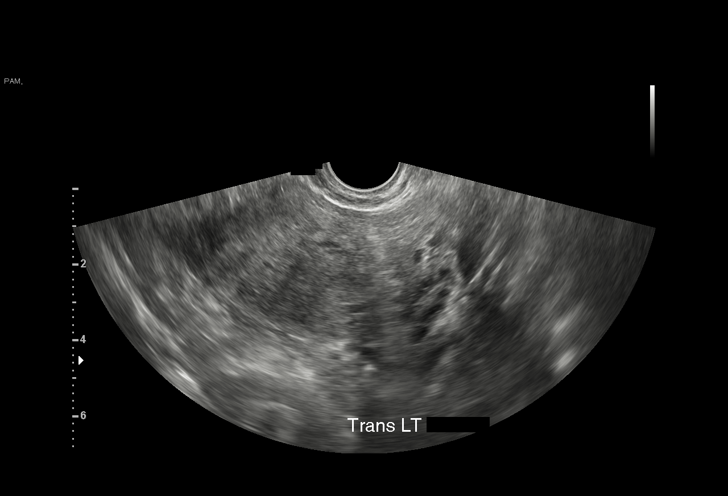

[15 of 28 positions shown; findings below may reference images not displayed]

FINDINGS: Intrauterine gestational sac: None

Yolk sac:  Not Visualized.

Embryo:  Not Visualized.

Cardiac Activity: Not Visualized.

Heart Rate:   bpm

MSD:   mm    w     d

CRL:    mm    w    d                  US EDC:

Subchorionic hemorrhage:  None visualized.

Maternal uterus/adnexae: No adnexal mass or free fluid. Fluid within
the endometrial canal.
IMPRESSION: No intrauterine pregnancy visualized. Differential considerations
would include early intrauterine pregnancy too early to visualize,
spontaneous abortion, or occult ectopic pregnancy. Recommend close
clinical followup and serial quantitative beta HCGs and ultrasounds.

## 2021-09-15 IMAGING — US US OB TRANSVAGINAL
1 series · 15 of 28 positions shown · non-contrast
Comparison: 01/29/2021.

CLINICAL DATA: Intermittent abdominal pain.

EXAM:
OBSTETRIC <14 WK US AND TRANSVAGINAL OB US
TECHNIQUE: Both transabdominal and transvaginal ultrasound examinations were
performed for complete evaluation of the gestation as well as the
maternal uterus, adnexal regions, and pelvic cul-de-sac.
Transvaginal technique was performed to assess early pregnancy.

[Series 1: us ob transvaginal · 33 acquisitions, 15 frames shown]
[im 1/33]
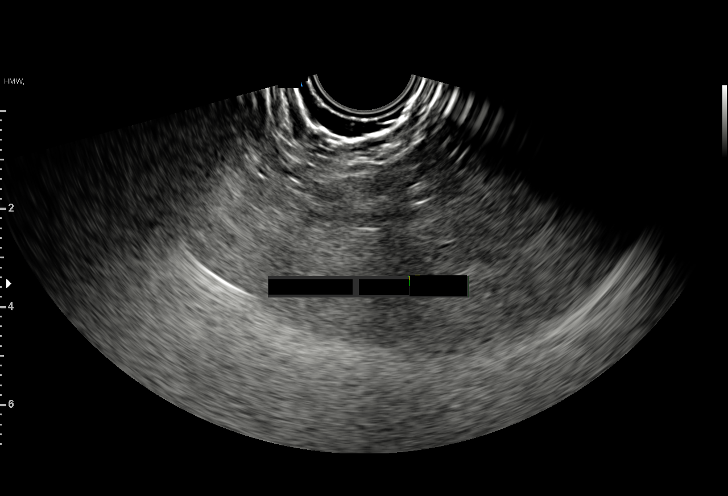
[im 3/33]
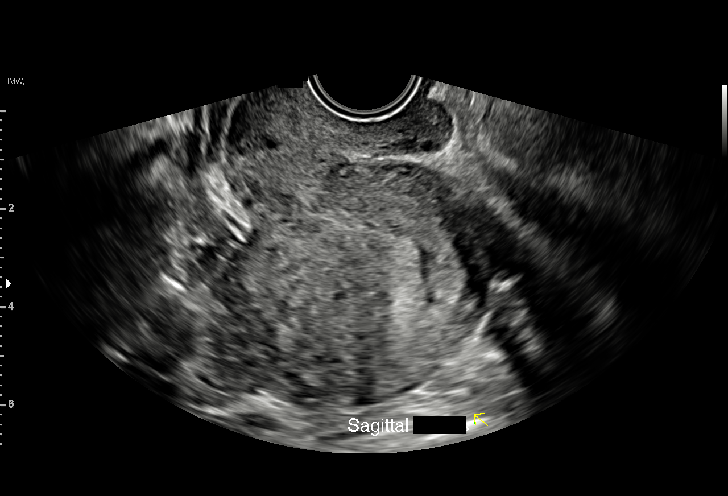
[im 5/33]
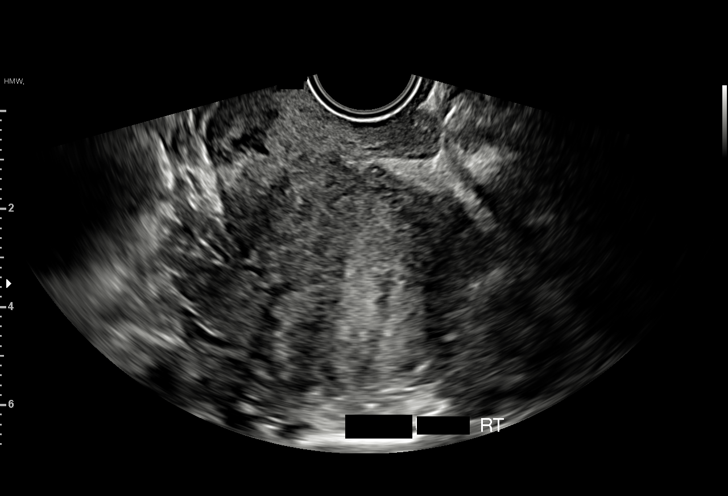
[im 8/33]
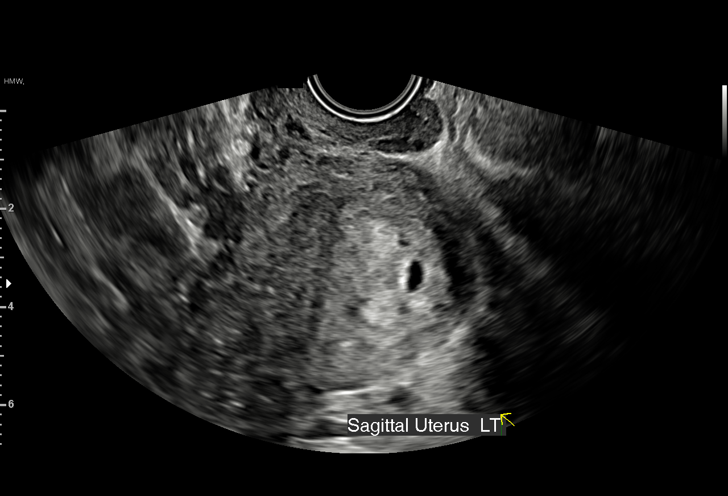
[im 10/33]
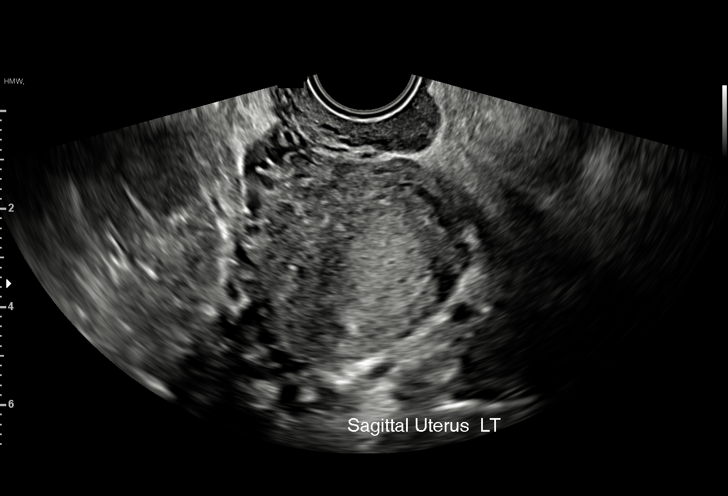
[im 12/33]
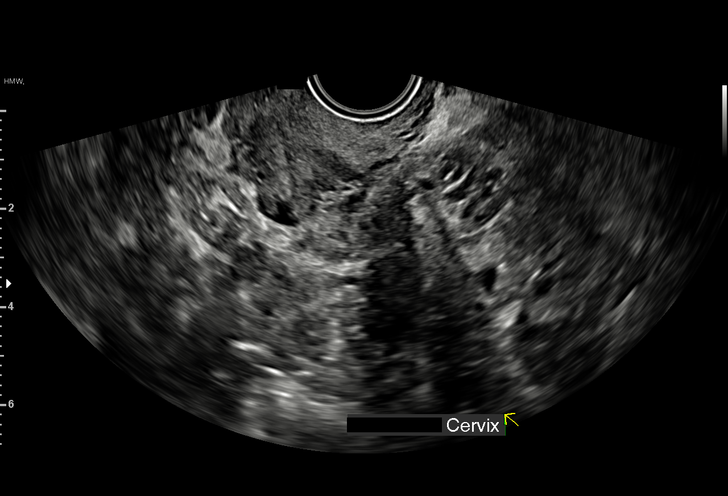
[im 15/33]
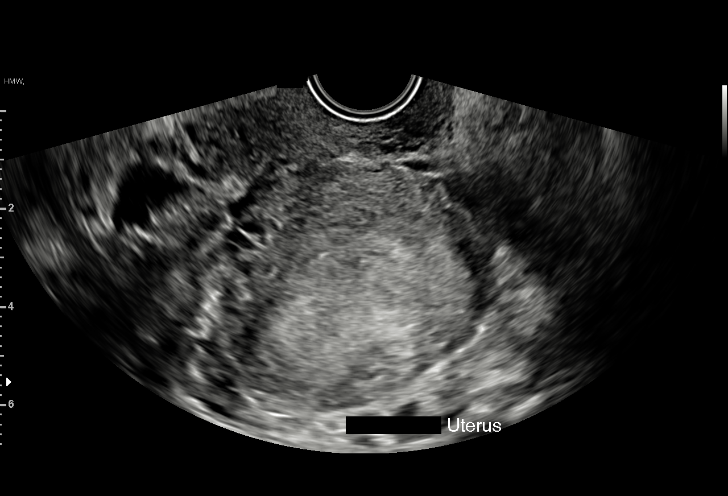
[im 17/33]
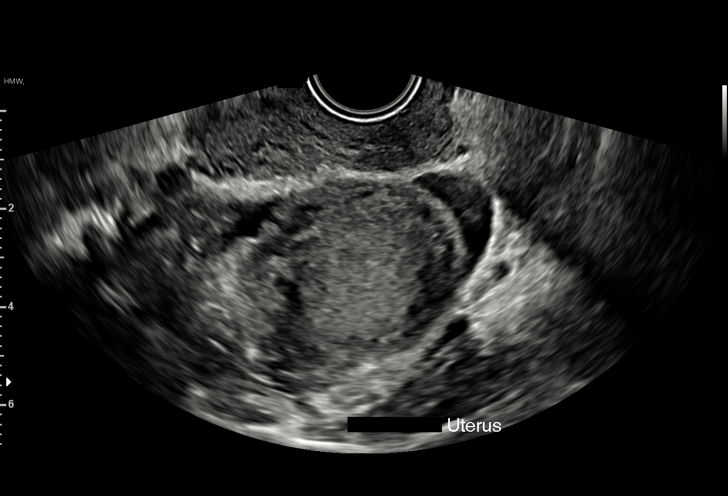
[im 18/33]
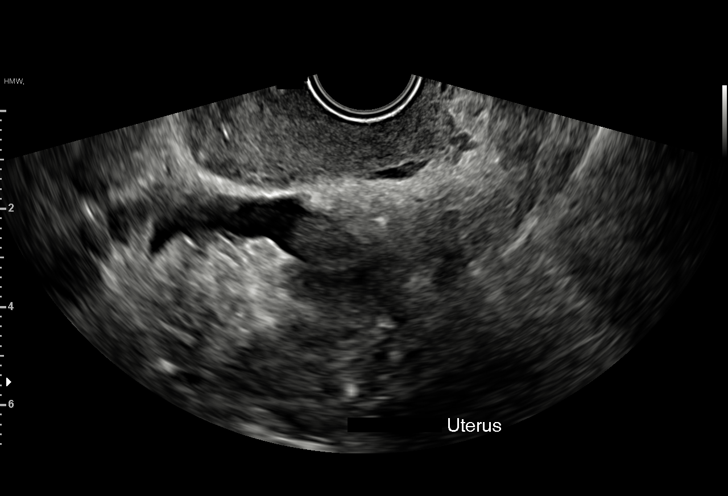
[im 21/33]
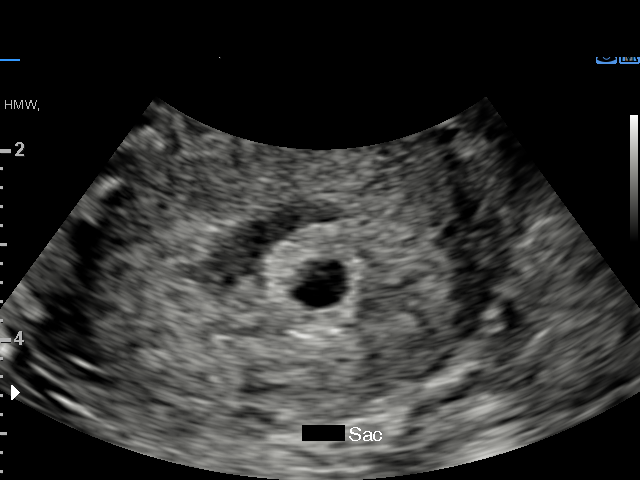
[im 23/33]
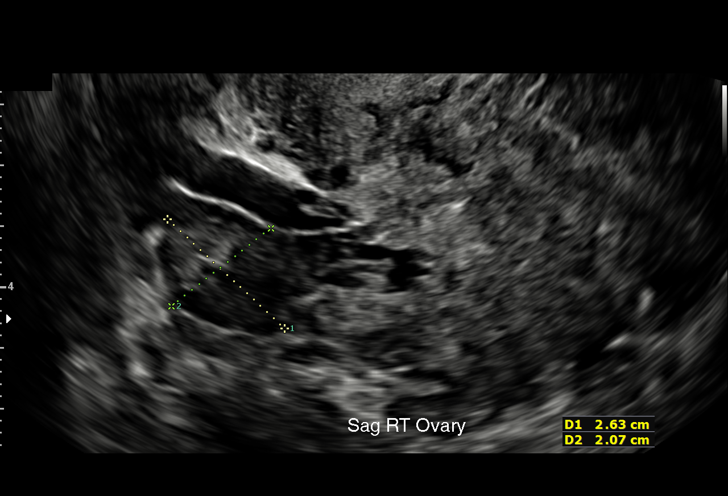
[im 25/33]
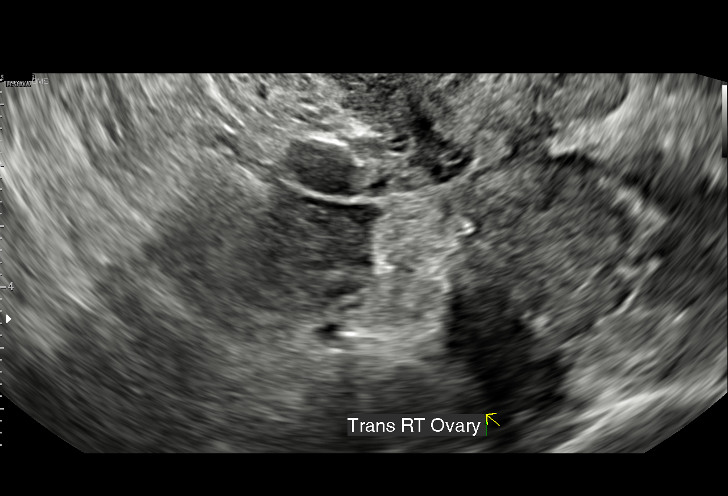
[im 28/33]
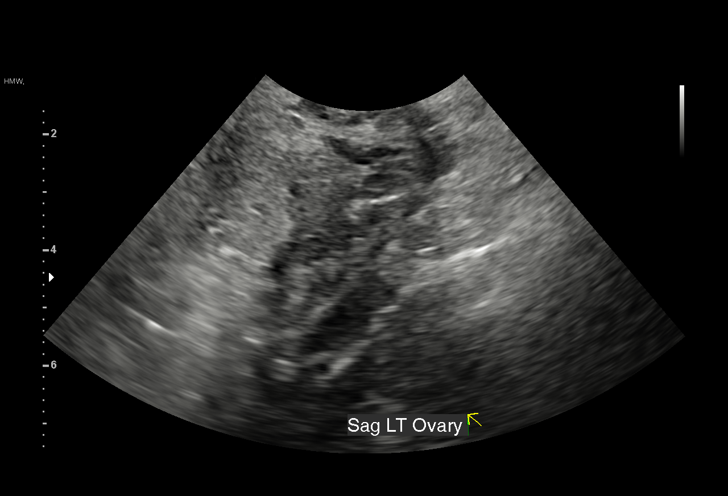
[im 30/33]
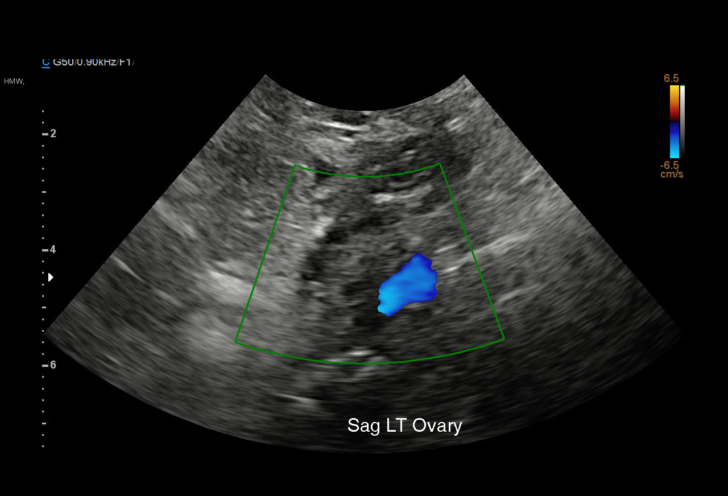
[im 33/33]
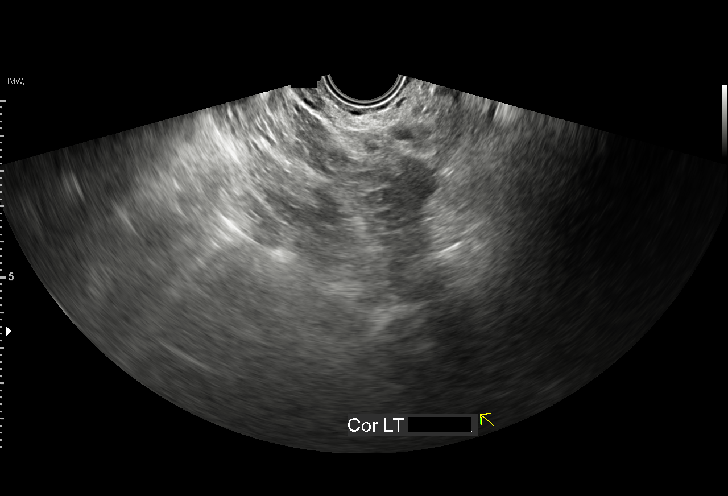

[15 of 28 positions shown; findings below may reference images not displayed]

FINDINGS: Intrauterine gestational sac: Single

Yolk sac:  None visualized

Embryo:  None visualized

Cardiac Activity: None visualized

MSD: 5.1 mm   5 w   2 d

Subchorionic hemorrhage: Small subchorionic hemorrhage cannot be
excluded.

Maternal uterus/adnexae: Unremarkable.
IMPRESSION: 1. Probable 5.1 mm intrauterine gestational sac. No yolk sac, fetal
pole, or cardiac activity yet visualized. Recommend follow-up
quantitative B-HCG levels and follow-up US in 14 days to assess
viability. This recommendation follows SRU consensus guidelines:
Diagnostic Criteria for Nonviable Pregnancy Early in the First
Trimester. [place] Engl J Med 3661; [DATE].

2.  Small subchorionic hemorrhage cannot be excluded.
